# Patient Record
Sex: Female | Born: 2000
Health system: Northeastern US, Academic
[De-identification: ages and names within clinical notes are randomized; demographics above are authoritative.]

---

## 2021-12-03 ENCOUNTER — Encounter: Payer: PRIVATE HEALTH INSURANCE | Attending: Family Medicine | Primary: Family Medicine

## 2022-01-01 ENCOUNTER — Ambulatory Visit
Admit: 2022-01-01 | Discharge: 2022-01-01 | Payer: PRIVATE HEALTH INSURANCE | Attending: Family Medicine | Primary: Family Medicine

## 2022-01-01 DIAGNOSIS — Z0000 Encounter for general adult medical examination without abnormal findings: Secondary | ICD-10-CM

## 2022-01-01 NOTE — Assessment & Plan Note (Signed)
Labs: declines today, not high risk for lipid or diabetes screening  Screenings: follows with OBGYN, pap UTD (ASCUS with +HPV)  Immunizations: HPV and Covid UTD. Believes Tdap is UTD. ROI signed.

## 2022-01-01 NOTE — Assessment & Plan Note (Signed)
Noted on first pap last week with OBGYN - planning on repeating in the fall.

## 2022-01-01 NOTE — Progress Notes (Signed)
[facility]  [facility]  [address]  [address] and [address]  [place] [address]  Dept: [phone]  Dept Fax: [phone]     Patient ID: Tagan Bartram is a 21 y.o. female who presents for annual exam.     Subjective   HPI  Last physical pre-pandemic, no abnormalities at that time.     Blood pressure - well controlled   Diet/Exercise - Pretty good, walks a lot and had lost about 10 lbs. No dietary restrictions.   LMP - July 25, regular   Birth control - Condoms    STD screening - done last week  PHQ9/GAD7 - No concerns   Vaccinations - Believes Tdap is UTD. HPV UTD. Covid UTD.   Vision - routine care, wears glasses   Dental - routine care  Pap smear - last done last week, ASCUS and +HPV. Plan to repeat in the fall   Mammogram - Not due. Imaging done in 2021, normal.  Colonoscopy - Not due   Lung cancer screening - Denies tobacco use   Bone density - N/A  Labwork - Declines    Patient Active Problem List   Diagnosis   . ASCUS with positive high risk HPV cervical   . Annual physical exam     No current outpatient medications  Allergies   Allergen Reactions   . Amoxicillin      No past medical history on file.    Objective   Visit Vitals  BP 116/80 (BP Location: Right arm, Patient Position: Sitting, BP Cuff Size: Adult)   Pulse 83   Temp 36.4 C (97.6 F) (Oral)   Ht 1.702 m   Wt 78.9 kg   LMP 12/18/2021   SpO2 99%   BMI 27.25 kg/m   OB Status Having periods   BSA 1.93 m       Physical Exam  Constitutional:       General: She is not in acute distress.  HENT:      Right Ear: Tympanic membrane, ear canal and external ear normal.      Left Ear: Tympanic membrane, ear canal and external ear normal.   Cardiovascular:      Rate and Rhythm: Normal rate and regular rhythm.      Pulses: Normal pulses.   Pulmonary:      Effort: Pulmonary effort is normal.      Breath sounds: Normal breath sounds.   Abdominal:       Palpations: Abdomen is soft.      Tenderness: There is no abdominal tenderness.   Musculoskeletal:      Cervical back: Neck supple.   Neurological:      Mental Status: She is alert and oriented to person, place, and time. Mental status is at baseline.   Psychiatric:         Mood and Affect: Mood normal.         Behavior: Behavior normal.         Thought Content: Thought content normal.         Assessment/Plan   Jhordan was seen today for annual exam.  Annual physical exam  Assessment & Plan:  Labs: declines today, not high risk for lipid or diabetes screening  Screenings: follows with OBGYN, pap UTD (ASCUS with +HPV)  Immunizations: HPV and Covid UTD. Believes Tdap is UTD. ROI signed.   ASCUS with positive high risk  HPV cervical  Assessment & Plan:  Noted on first pap last week with OBGYN - planning on repeating in the fall.

## 2022-01-13 NOTE — Telephone Encounter (Signed)
-----   Message from Abbe Amsterdam, MD sent at 01/08/2022  4:56 PM EDT -----  Regarding: Tdap  For some reason, I am unable to send patient a message via the portal. This is not urgent at all but can you call her to let her know I reviewed her records and I don't see any up to date Tdap vaccines The last one I see from the record is from 2012, which means she is due now. Feel to schedule on the lab schedule anytime.

## 2022-01-13 NOTE — Telephone Encounter (Signed)
Called patient and relayed message, pt will be seen on 01/15/2022 @8am      No further actions needed.

## 2022-01-15 ENCOUNTER — Encounter: Payer: PRIVATE HEALTH INSURANCE | Primary: Family Medicine

## 2022-01-22 ENCOUNTER — Encounter: Payer: PRIVATE HEALTH INSURANCE | Primary: Family Medicine

## 2022-11-13 ENCOUNTER — Ambulatory Visit
Admit: 2022-11-13 | Discharge: 2022-11-13 | Payer: BLUE CROSS/BLUE SHIELD | Attending: Student in an Organized Health Care Education/Training Program | Primary: Family Medicine

## 2022-11-13 DIAGNOSIS — Z01419 Encounter for gynecological examination (general) (routine) without abnormal findings: Secondary | ICD-10-CM

## 2022-11-13 NOTE — Progress Notes (Signed)
[facility]   [address] [place], [place]  [address] [place], [place]        Chief Complaint: annual well woman exam       Annual Exam-Premenopausal  Patient presents for annual exam. The patient would like to discuss hirsutism today. Reports it has been marked on her face in the past 6 months. The patient is sexually active. Patient reports periods are  regular but cycle slightly longer this past month . Currently works at [employer] but moving home to [place].       Contraception: none  STI screening: Has never been diagnosed with an STI  Pap: ASCUS last year  HPV: positive last year  Mammogram: not yet indicated  Colonoscopy: not yet indicated    Smoking history: none    Family Hx:   Breast cancer: paternal aunt   Ovarian cancer: denies  Colon cancer: denies     Patient Active Problem List    Diagnosis Date Noted    ASCUS with positive high risk HPV cervical 01/01/2022    Annual physical exam 01/01/2022         No past medical history on file.    No past surgical history on file.    OB History    No obstetric history on file.              Medication Documentation Review Audit       Reviewed by Salome Holmes,  (Medical Assistant) on 11/13/22 at 0850      Medication Order Taking? Sig Documenting Provider Last Dose Status            No Medications to Display                                   Allergies   Allergen Reactions    Amoxicillin        Social History     Socioeconomic History    Marital status: Unknown     Spouse name: Not on file    Number of children: Not on file    Years of education: Not on file    Highest education level: Not on file   Occupational History    Not on file   Tobacco Use    Smoking status: Never    Smokeless tobacco: Never   Substance and Sexual Activity    Alcohol use: Not Currently    Drug use: Not Currently    Sexual activity: Not Currently   Other Topics Concern    Not on file   Social History Narrative    Not on file      Social Determinants of Health     Financial Resource Strain: Medium Risk (11/06/2022)    Overall Financial Resource Strain (CARDIA)     Difficulty of Paying Living Expenses: Somewhat hard   Food Insecurity: Food Insecurity Present (11/06/2022)    Hunger Vital Sign     Worried About Running Out of Food in the Last Year: Sometimes true     Ran Out of Food in the Last Year: Not on file   Transportation Needs: Unmet Transportation Needs (11/06/2022)    PRAPARE - Therapist, art (Medical): Yes     Lack of Transportation (Non-Medical): No   Physical Activity: Not on file   Stress: Not on file   Social Connections:  Not on file   Intimate Partner Violence: Not on file   Housing Stability: Unknown (11/06/2022)    Housing Stability Vital Sign     Unable to Pay for Housing in the Last Year: Not on file     Number of Places Lived in the Last Year: Not on file     Unstable Housing in the Last Year: No       Family History   Problem Relation Name Age of Onset    Drug abuse Father Angie Piercey     Alcohol abuse Mother's Brother Anna Genre     Breast cancer Father's Sister Avigail Pilling            Visit Vitals  BP 110/70   LMP 10/27/2022 (Exact Date)   OB Status Having periods         Physcial Exam       Physical Exam     Gen: Alert, oriented, sitting comfortably on exam table   HEENT: normocephalic, atraumatic, sclera white, nonicteric  CV/Lung: deferred  Breast: symmetric, nontender, nipples normal, no nipple discharge, no masses bilaterally in breasts or axillae  Abd: flat, soft, nondistended, nontender, no rebound or guarding, no masses  GU:  no ext lesions, speculum exam reveals rugated vagina, physiologic discharge, cervix normal without mass or lesion, urethra normal, perineum normal   BME: no CMT, uterus normal size, no masses   Ovaries: Bilateral ovaries small, normal, no masses or tenderness   Ext: nontender no edema        A/P     This is a 22 y.o. nulligravid female that presents for her  annual     - Exam appropriate for her age and history   - Pap: collected today   - STD testing: ordered  - HPV vaccine complete  - Contraception: OCP started, reviewed risks, benefits and how to start   - hirsutism marked in the past 6 months -- TSH, FSH/LH and testosterone ordered   - Exercise 30 min, 3-5 times per week.   - Return 1 yr or PRN    Tollie Eth, MD  Obstetrics and Gynecology

## 2022-11-14 LAB — RPR: RPR: NONREACTIVE

## 2022-11-14 LAB — HCV QN INTERP (REFLEX)

## 2022-11-14 LAB — HIV AB/AG SCREEN: HIV Scr 4th Gen: NONREACTIVE

## 2022-11-14 LAB — FSH AND LH
FSH: 4.2 m[IU]/mL
LH: 5.7 m[IU]/mL

## 2022-11-14 LAB — ACUTE HEPATITIS PANEL
HCV Ab: NONREACTIVE
Hep A IgM Ab: NEGATIVE
Hep B Core IgM Ab: NEGATIVE
Hep B Surf Ag Scr: NEGATIVE

## 2022-11-14 LAB — TSH WITH REFLEX: TSH: 1.43 u[IU]/mL (ref 0.450–4.500)

## 2022-11-15 LAB — VAGINITIS PANEL WITH CT/NG/TRICH
C parapsilosis/tropicalis: NEGATIVE
Candida albicans, NAA: NEGATIVE
Candida glabrata, NAA: NEGATIVE
Candida krusei, NAA: NEGATIVE
Candida lusitaniae, NAA: NEGATIVE
Chlamydia trachomatis, NAA: NEGATIVE
Neisseria gonorrhoeae, NAA: NEGATIVE
Trich vag by NAA: NEGATIVE

## 2022-11-18 LAB — TESTOSTERONE, FREE, TOTAL AND SEX HORMONE BINDING GLOBULIN
% Free Testost: 2.55 % (ref 0.50–2.80)
Sex Horm Bind Glob: 33.5 nmol/L (ref 24.6–122.0)
Testost Free: 0.38 ng/dL (ref 0.10–0.85)
Testosterone: 15 ng/dL (ref 13–71)

## 2022-11-19 LAB — PAP SMEAR + CT/GC NAA + APTIMA HPV W/ RFLX TO HPV GENOTYPES 16/18, 45 ON POS
Chlamydia NAA: NEGATIVE
Cyto Comments: 0
Gonococcus NAA: NEGATIVE
HPV Aptima: POSITIVE — AB

## 2022-11-19 LAB — HPV GENOTYPES 16, 18/45
HPV Genotype 16: NEGATIVE
HPV Genotype 18,45: NEGATIVE

## 2023-01-22 IMAGING — [facility] [HOSPITAL]^LOMBAR
6 series · 48 of 48 positions shown · non-contrast
Comparison: none

[Series 2: coronal_t2_quiet · coronal · 5.5mm · 1.09mm/px · 8 of 15 slices shown]
[im 1/15]
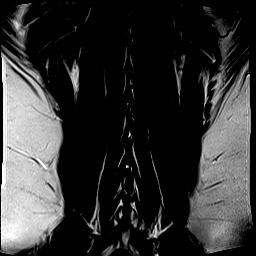
[im 3/15]
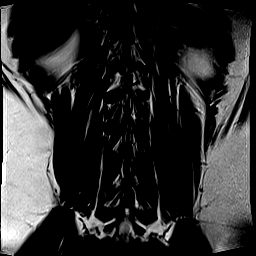
[im 5/15]
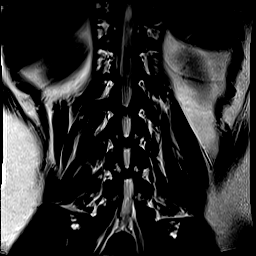
[im 7/15]
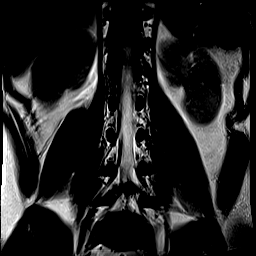
[im 9/15]
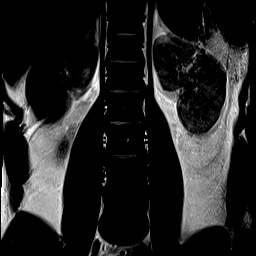
[im 11/15]
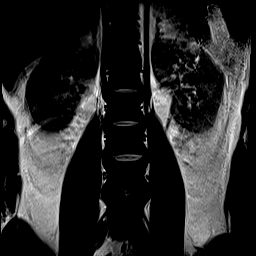
[im 13/15]
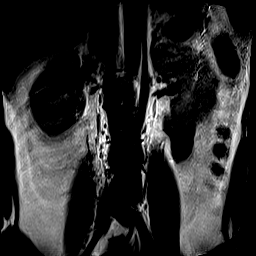
[im 15/15]
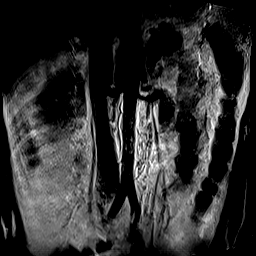

[Series 3: sagital_t2_quiet · sagittal · 4.0mm · 0.88mm/px · 5 of 12 slices shown]
[im 1/12]
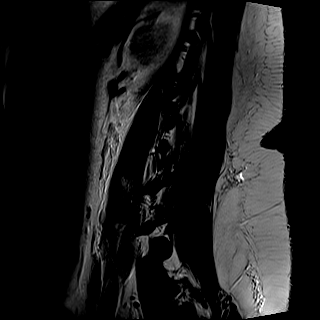
[im 3/12]
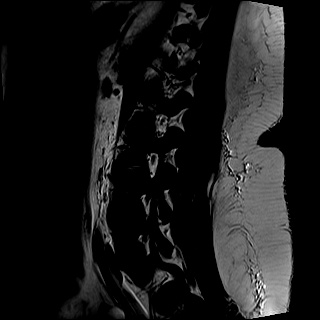
[im 6/12]
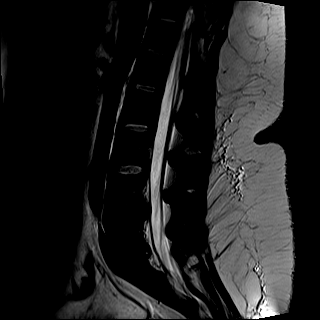
[im 9/12]
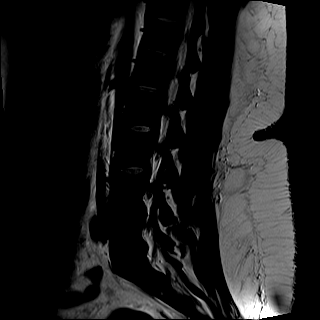
[im 12/12]
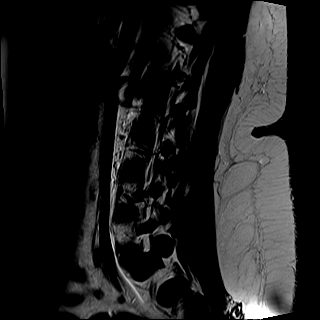

[Series 4: sagital_stir_quiet · sagittal · 4.0mm · 1.05mm/px · 5 of 12 slices shown]
[im 1/12]
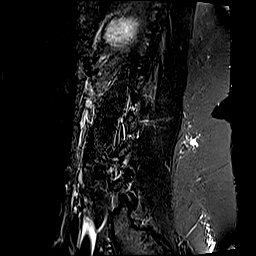
[im 3/12]
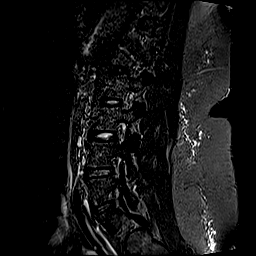
[im 6/12]
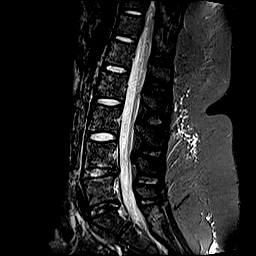
[im 9/12]
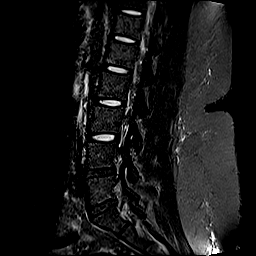
[im 12/12]
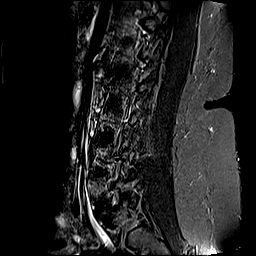

[Series 5: sagital_t1_quiet · sagittal · 4.0mm · 0.88mm/px · 5 of 12 slices shown]
[im 1/12]
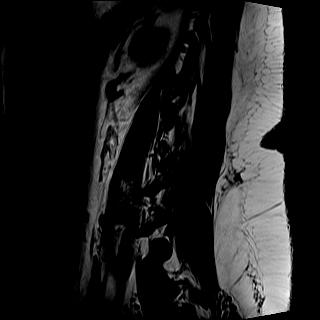
[im 3/12]
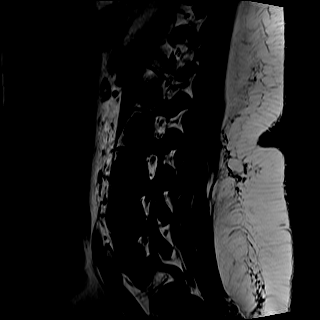
[im 6/12]
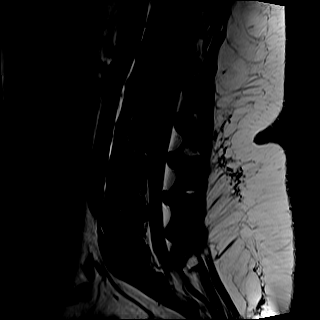
[im 9/12]
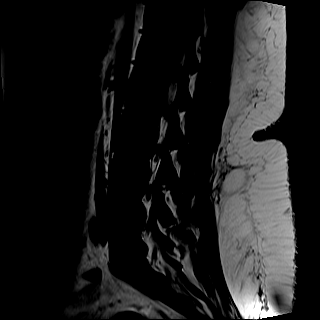
[im 12/12]
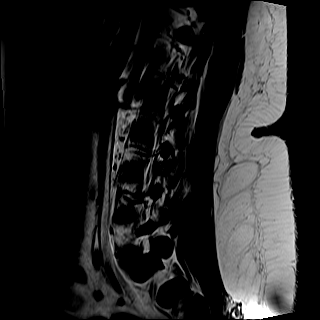

[Series 6: axial_t2_quiet · axial · 4.0mm · 0.69mm/px · z∈[-111,+96]mm · 11 of 25 slices shown]
[im 1/25]
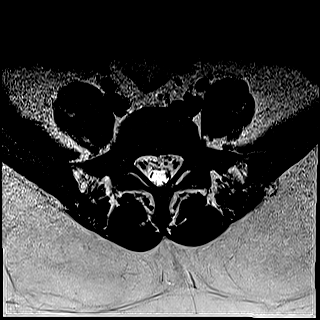
[im 3/25]
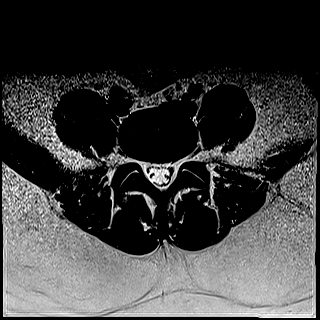
[im 5/25]
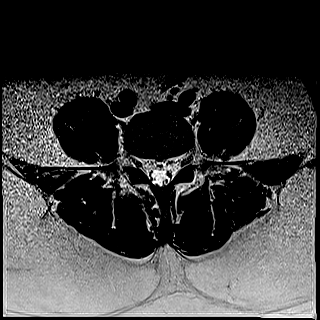
[im 8/25]
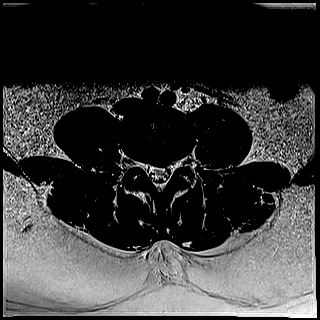
[im 10/25]
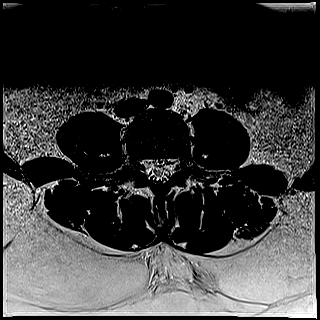
[im 13/25]
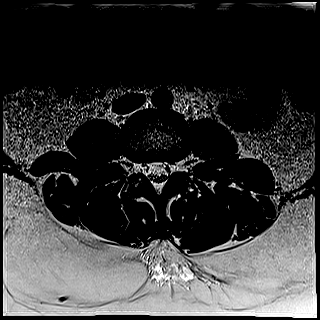
[im 15/25]
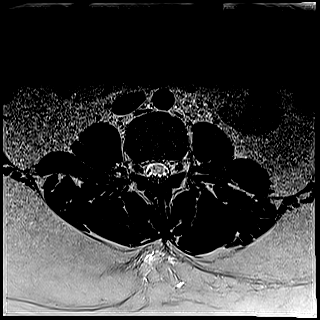
[im 17/25]
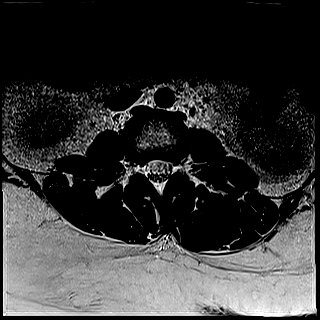
[im 20/25]
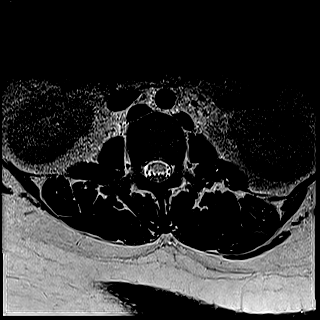
[im 22/25]
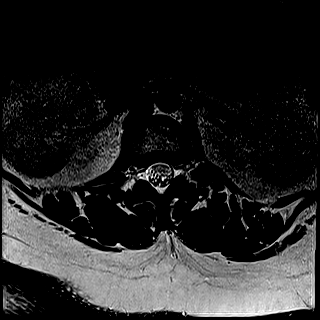
[im 25/25]
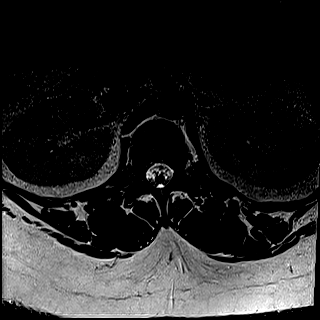

[Series 7: axial_t2_tse_bloco · axial · 4.0mm · 0.69mm/px · z∈[-98,+70]mm · 14 of 32 slices shown]
[im 1/32]
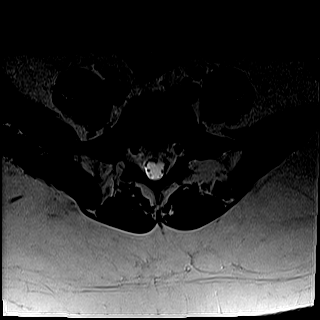
[im 3/32]
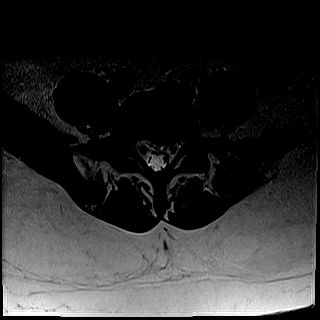
[im 5/32]
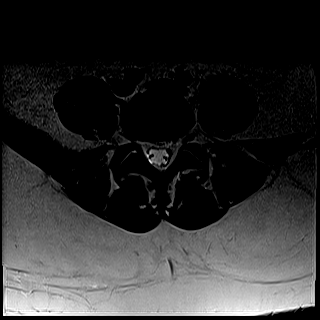
[im 8/32]
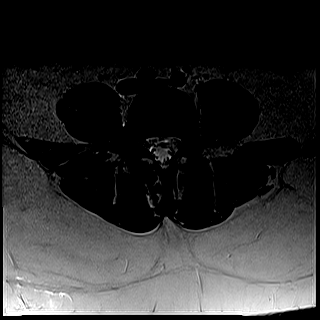
[im 10/32]
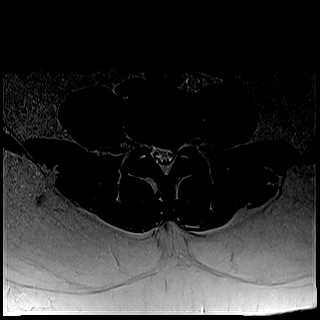
[im 12/32]
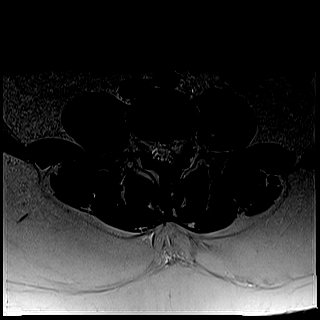
[im 15/32]
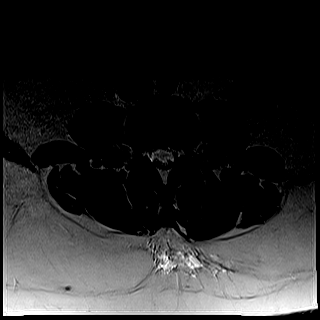
[im 17/32]
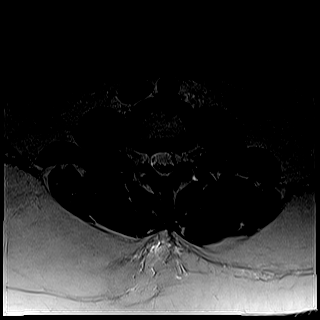
[im 20/32]
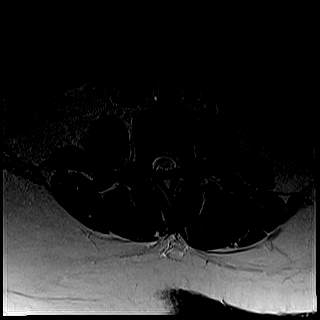
[im 22/32]
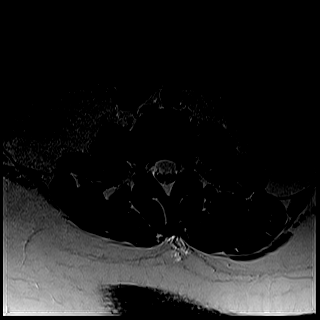
[im 24/32]
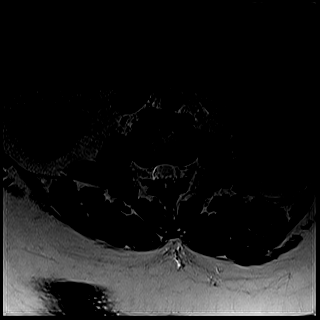
[im 27/32]
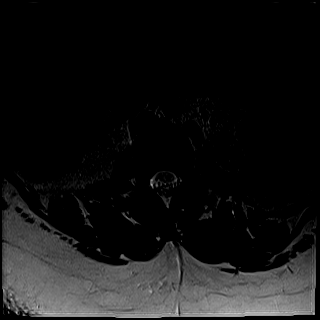
[im 29/32]
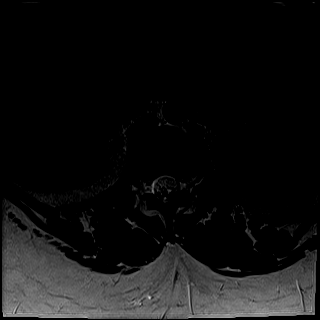
[im 32/32]
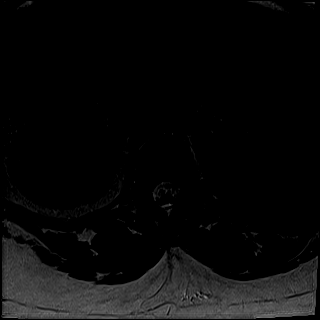

[48 of 48 positions shown; findings below may reference images not displayed]

RESSONÂNCIA MAGNÉTICA [facility] LOMBAR

TÉCNICA:
Exame realizado em aparelho de ressonância magnética, com aquisição de imagens em sequências multiplanares, ponderadas em T1, T2 e STIR. Sem utilização do meio de contraste.

ANÁLISE:
Corpos vertebrais íntegros e com alinhamento posterior preservado.
Medula óssea com intensidade de sinal normal.
Arcos posteriores com morfologia e intensidade de sinal normais.
Desidratação e hérnia central nos discos de L4-L5 e L5-S1.
Demais discos intervertebrais com intensidade de sinal habitual, sem evidências de deslocamentos significativos.
Canal vertebral e forames neurais com amplitude satisfatória.
O cone medular é visualizado em topografia habitual e não mostra alteração.
A distribuição das raízes no interior do saco dural se faz de modo anatômico.
Partes moles paravertebrais sem alteração.

IMPRESSÃO DIAGNÓSTICA:
Desidratação e hérnia central nos discos de L4-L5 e L5-S1.
